# Patient Record
Sex: Female | Born: 2005 | Race: White | Hispanic: No | Marital: Single | Smoking: Never smoker
Health system: Southern US, Community
[De-identification: ages and names within clinical notes are randomized; demographics above are authoritative.]

## PROBLEM LIST (undated history)

## (undated) DIAGNOSIS — S069XAA Unspecified intracranial injury with loss of consciousness status unknown, initial encounter: Secondary | ICD-10-CM

## (undated) DIAGNOSIS — F32A Depression, unspecified: Secondary | ICD-10-CM

## (undated) DIAGNOSIS — J45909 Unspecified asthma, uncomplicated: Secondary | ICD-10-CM

## (undated) DIAGNOSIS — D649 Anemia, unspecified: Secondary | ICD-10-CM

---
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
## 2023-01-04 ENCOUNTER — Ambulatory Visit (HOSPITAL_COMMUNITY): Payer: No Typology Code available for payment source

## 2023-01-06 NOTE — L&D Delivery Note (Signed)
 Delivery Note Alyssa Cook is a 18 y.o. G1 now P1001 who had a spontaneous vaginal delivery [redacted]w[redacted]d after augmentation of labor.  At 20:11 a healthy baby boy was delivered via  (Presentation: vertex ; LOA  ).  APGAR: 8,9 ; weight 7lb5oz .     Admitted for rupture of membranes. Augmented with pitocin. Progressed normally. Received epidural for pain management. Pushed for 21 minutes. Baby was delivered without difficulty. No nuchal cord. Baby was placed on maternal abdomen where routine bulb suction and stimulation was performed. Delayed cord clamping for 60 seconds. Cord was clamped by me and cut by FOB. Cord blood was collected. Delivery of placenta was spontaneous. Placenta was found to be intact, 3-vessel cord was noted. The fundus was found to be firm. 2nd degree perineal laceration and 1st degree left sided vaginal laceration were repaired in the normal sterile fashion with 3-0 vicryl rapide. Estimated blood loss 250cc. Cord gases not sent. Instrument and gauze counts were correct at the end of the procedure. Fundus was firm, bleeding was minimal, and both mother and baby were doing well when I left the room.   Placenta status: intact, appeared to have multiple calcifications and fatty inclusions, marginal cord. Sent to pathology. Anesthesia:  epidural Episiotomy:  none Lacerations:  . 2nd degree perineal and 1st degree left sided vaginal  Suture Repair: 3.0 vicryl rapide Est. Blood Loss (mL):  250cc  Mom to postpartum.  Baby to Couplet care / Skin to Skin.  Mother still considering circumcision for baby boy Alyssa Cook. Will defer placing orders until further discussion takes place on rounds tomorrow.   Alyssa Cook 10/29/2023, 8:42 PM

## 2023-01-07 ENCOUNTER — Ambulatory Visit (HOSPITAL_COMMUNITY): Payer: No Typology Code available for payment source | Attending: Physician Assistant | Admitting: Speech Pathology

## 2023-01-07 ENCOUNTER — Encounter (HOSPITAL_COMMUNITY): Payer: Self-pay | Admitting: Speech Pathology

## 2023-01-07 DIAGNOSIS — R490 Dysphonia: Secondary | ICD-10-CM | POA: Diagnosis present

## 2023-01-07 DIAGNOSIS — R471 Dysarthria and anarthria: Secondary | ICD-10-CM | POA: Diagnosis present
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
## 2023-01-14 ENCOUNTER — Encounter (HOSPITAL_COMMUNITY): Payer: No Typology Code available for payment source | Admitting: Speech Pathology

## 2023-01-21 ENCOUNTER — Ambulatory Visit (HOSPITAL_COMMUNITY): Payer: No Typology Code available for payment source | Admitting: Speech Pathology

## 2023-01-21 ENCOUNTER — Encounter (HOSPITAL_COMMUNITY): Payer: Self-pay | Admitting: Speech Pathology

## 2023-01-21 DIAGNOSIS — R490 Dysphonia: Secondary | ICD-10-CM
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
## 2023-01-27 ENCOUNTER — Other Ambulatory Visit (INDEPENDENT_AMBULATORY_CARE_PROVIDER_SITE_OTHER): Payer: Self-pay | Admitting: Otolaryngology

## 2023-01-28 ENCOUNTER — Ambulatory Visit (HOSPITAL_COMMUNITY): Payer: No Typology Code available for payment source | Admitting: Speech Pathology

## 2023-01-28 ENCOUNTER — Encounter (HOSPITAL_COMMUNITY): Payer: Self-pay | Admitting: Speech Pathology

## 2023-01-28 DIAGNOSIS — R490 Dysphonia: Secondary | ICD-10-CM
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
## 2023-02-02 ENCOUNTER — Encounter
Payer: No Typology Code available for payment source | Attending: Physical Medicine and Rehabilitation | Admitting: Psychology

## 2023-02-02 DIAGNOSIS — J383 Other diseases of vocal cords: Secondary | ICD-10-CM | POA: Diagnosis present

## 2023-02-02 DIAGNOSIS — F32A Depression, unspecified: Secondary | ICD-10-CM

## 2023-02-02 DIAGNOSIS — F09 Unspecified 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: Secondary | ICD-10-CM | POA: Diagnosis not present

## 2023-02-02 DIAGNOSIS — F4310 Post-traumatic stress disorder, unspecified: Secondary | ICD-10-CM | POA: Diagnosis present

## 2023-02-02 DIAGNOSIS — R45851 Suicidal ideations: Secondary | ICD-10-CM

## 2023-02-02 DIAGNOSIS — S062X9S 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, sequela: Secondary | ICD-10-CM | POA: Insufficient documentation

## 2023-02-04 ENCOUNTER — Ambulatory Visit (HOSPITAL_COMMUNITY): Payer: No Typology Code available for payment source | Admitting: Speech Pathology

## 2023-02-04 ENCOUNTER — Encounter (HOSPITAL_COMMUNITY): Payer: Self-pay | Admitting: Speech Pathology

## 2023-02-04 DIAGNOSIS — R490 Dysphonia: Secondary | ICD-10-CM | POA: Diagnosis not present

## 2023-02-04 DIAGNOSIS — R471 Dysarthria and anarthria: Secondary | ICD-10-CM
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
## 2023-02-11 ENCOUNTER — Ambulatory Visit (HOSPITAL_COMMUNITY): Payer: No Typology Code available for payment source | Attending: Physician Assistant | Admitting: Speech Pathology

## 2023-02-11 ENCOUNTER — Encounter (HOSPITAL_COMMUNITY): Payer: Self-pay | Admitting: Speech Pathology

## 2023-02-11 DIAGNOSIS — R471 Dysarthria and anarthria: Secondary | ICD-10-CM | POA: Diagnosis present

## 2023-02-11 DIAGNOSIS — R490 Dysphonia: Secondary | ICD-10-CM | POA: Diagnosis present
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
## 2023-02-17 ENCOUNTER — Encounter
Payer: No Typology Code available for payment source | Attending: Physical Medicine and Rehabilitation | Admitting: Physical Medicine & Rehabilitation

## 2023-02-17 ENCOUNTER — Encounter: Payer: Self-pay | Admitting: Physical Medicine & Rehabilitation

## 2023-02-17 DIAGNOSIS — F4310 Post-traumatic stress disorder, unspecified: Secondary | ICD-10-CM | POA: Insufficient documentation

## 2023-02-17 MED ORDER — PRAZOSIN HCL 2 MG PO CAPS: 2.0000 mg | ORAL_CAPSULE | Freq: Every day | ORAL | 6 refills | Status: DC
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
## 2023-02-18 ENCOUNTER — Encounter (HOSPITAL_BASED_OUTPATIENT_CLINIC_OR_DEPARTMENT_OTHER): Payer: No Typology Code available for payment source | Admitting: Psychology

## 2023-02-18 DIAGNOSIS — R45851 Suicidal ideations: Secondary | ICD-10-CM

## 2023-02-18 DIAGNOSIS — F32A Depression, unspecified: Secondary | ICD-10-CM | POA: Diagnosis not present

## 2023-02-18 DIAGNOSIS — F4310 Post-traumatic stress disorder, unspecified: Secondary | ICD-10-CM

## 2023-02-18 DIAGNOSIS — F09 Unspecified 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: Secondary | ICD-10-CM

## 2023-02-18 DIAGNOSIS — S062X9S 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, sequela: Secondary | ICD-10-CM

## 2023-03-01 ENCOUNTER — Ambulatory Visit (INDEPENDENT_AMBULATORY_CARE_PROVIDER_SITE_OTHER): Payer: No Typology Code available for payment source | Admitting: Otolaryngology

## 2023-03-01 ENCOUNTER — Encounter (INDEPENDENT_AMBULATORY_CARE_PROVIDER_SITE_OTHER): Payer: Self-pay | Admitting: Otolaryngology

## 2023-03-01 VITALS — BP 110/71 | HR 74

## 2023-03-01 DIAGNOSIS — J3801 Paralysis of vocal cords and larynx, unilateral: Secondary | ICD-10-CM

## 2023-03-01 DIAGNOSIS — K219 Gastro-esophageal reflux disease without esophagitis: Secondary | ICD-10-CM

## 2023-03-01 DIAGNOSIS — R0981 Nasal congestion: Secondary | ICD-10-CM | POA: Diagnosis not present

## 2023-03-01 DIAGNOSIS — R490 Dysphonia: Secondary | ICD-10-CM | POA: Diagnosis not present

## 2023-03-01 DIAGNOSIS — J3089 Other allergic rhinitis: Secondary | ICD-10-CM

## 2023-03-01 DIAGNOSIS — J383 Other diseases of vocal cords: Secondary | ICD-10-CM | POA: Diagnosis not present

## 2023-03-01 DIAGNOSIS — R0982 Postnasal drip: Secondary | ICD-10-CM

## 2023-03-01 DIAGNOSIS — Z87820 Personal history of traumatic brain injury: Secondary | ICD-10-CM

## 2023-03-01 NOTE — Patient Instructions (Signed)
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
## 2023-03-09 ENCOUNTER — Ambulatory Visit: Payer: No Typology Code available for payment source | Admitting: Urology

## 2023-03-09 ENCOUNTER — Encounter: Payer: Self-pay | Admitting: Urology

## 2023-03-09 VITALS — BP 113/72 | HR 93 | Temp 99.0°F

## 2023-03-09 DIAGNOSIS — Z3200 Encounter for pregnancy test, result unknown: Secondary | ICD-10-CM | POA: Diagnosis not present

## 2023-03-09 DIAGNOSIS — R3915 Urgency of urination: Secondary | ICD-10-CM

## 2023-03-09 DIAGNOSIS — N3941 Urge incontinence: Secondary | ICD-10-CM | POA: Diagnosis not present

## 2023-03-09 LAB — BLADDER SCAN AMB NON-IMAGING: Scan Result: 0

## 2023-03-09 NOTE — Progress Notes (Signed)
 Bladder Scan completed today.  Patient cannot void prior to the bladder scan. Bladder scan result: 0  Performed By: [facility] LPN

## 2023-03-10 LAB — PREGNANCY, URINE: Preg Test, Ur: POSITIVE — AB

## 2023-03-10 LAB — URINALYSIS, ROUTINE W REFLEX MICROSCOPIC
Bilirubin, UA: NEGATIVE
Glucose, UA: NEGATIVE
Ketones, UA: NEGATIVE
Nitrite, UA: NEGATIVE
Protein,UA: NEGATIVE
RBC, UA: NEGATIVE
Specific Gravity, UA: 1.015 (ref 1.005–1.030)
Urobilinogen, Ur: 1 mg/dL (ref 0.2–1.0)
pH, UA: 8 — ABNORMAL HIGH (ref 5.0–7.5)

## 2023-03-10 LAB — MICROSCOPIC EXAMINATION

## 2023-03-10 NOTE — Progress Notes (Signed)
 Spoke with patient via phone to inform her that her pregnancy test was positive. She was instructed to start taking a prenatal vitamin daily and to contact her OB/GYN provider to schedule an appointment in the near future. Pt verbalized understanding and agreement.

## 2023-03-11 ENCOUNTER — Encounter
Payer: No Typology Code available for payment source | Attending: Physical Medicine and Rehabilitation | Admitting: Psychology

## 2023-03-11 DIAGNOSIS — S062X9S 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, sequela: Secondary | ICD-10-CM | POA: Diagnosis not present

## 2023-03-11 DIAGNOSIS — R45851 Suicidal ideations: Secondary | ICD-10-CM

## 2023-03-11 DIAGNOSIS — F09 Unspecified 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: Secondary | ICD-10-CM | POA: Diagnosis not present

## 2023-03-11 DIAGNOSIS — F4310 Post-traumatic stress disorder, unspecified: Secondary | ICD-10-CM | POA: Insufficient documentation

## 2023-03-11 DIAGNOSIS — F32A Depression, unspecified: Secondary | ICD-10-CM | POA: Diagnosis not present

## 2023-03-15 ENCOUNTER — Other Ambulatory Visit (INDEPENDENT_AMBULATORY_CARE_PROVIDER_SITE_OTHER): Payer: Self-pay | Admitting: Otolaryngology

## 2023-03-25 ENCOUNTER — Ambulatory Visit: Payer: No Typology Code available for payment source | Admitting: Psychology

## 2023-04-01 ENCOUNTER — Encounter (INDEPENDENT_AMBULATORY_CARE_PROVIDER_SITE_OTHER): Payer: Self-pay | Admitting: Otolaryngology

## 2023-04-01 ENCOUNTER — Ambulatory Visit (INDEPENDENT_AMBULATORY_CARE_PROVIDER_SITE_OTHER): Payer: No Typology Code available for payment source | Admitting: Otolaryngology

## 2023-04-01 VITALS — BP 115/72 | HR 74

## 2023-04-01 DIAGNOSIS — J3801 Paralysis of vocal cords and larynx, unilateral: Secondary | ICD-10-CM | POA: Diagnosis not present

## 2023-04-01 DIAGNOSIS — J383 Other diseases of vocal cords: Secondary | ICD-10-CM | POA: Diagnosis not present

## 2023-04-01 DIAGNOSIS — R490 Dysphonia: Secondary | ICD-10-CM | POA: Diagnosis not present

## 2023-04-01 DIAGNOSIS — S069X9S Unspecified intracranial injury with loss of consciousness of unspecified duration, sequela: Secondary | ICD-10-CM

## 2023-04-01 NOTE — Progress Notes (Signed)
 Therapeutic Vocal Cord Injection CPT 28413-24 ATTENDING: Ashok Croon, MD   PREOPERATIVE DIAGNOSIS(ES): 1. left vocal cord paresis 2. Hoarseness; VF atrophy bilateral 3. Glottic insufficiency  POSTOPERATIVE DIAGNOSIS(ES): Same  PROCEDURE PERFORMED: Laryngoscopy with restylane injection into the bilateral lateral thyroarytenoid muscle(s)  INDICATIONS FOR PROCEDURE: The risks and benefits of the surgical procedure have been explained in detail to the patient and they have elected to proceed.  CONSENT:  Informed consent was obtained prior to the procedure after discussion of risks, benefits, and alternatives and expected outcomes were discussed with the patient; consent placed in chart. The possibilities of reaction to medication, pulmonary aspiration, bleeding, infection, the need for additional procedures, failure to diagnose a condition, and creating a complication requiring transfusion or operation were discussed with the patient. The patient concurred with the proposed plan, giving informed consent.    UNIVERSAL PROTOCOL/ TIMEOUT: Preprocedure verification is complete- patient verified and consents confirmed.  ANESTHESIA: local anesthesia H&P REVIEW: The patient's history and physical were reviewed today prior to procedure. All medications were reviewed and updated as well.  PROCEDURE DETAILS:  The patient was brought to the clinic and placed in a seated position.  The anterior neck skin was then cleansed with alcohol and 1% Lidocaine was used to infiltrate the skin overlying the thyrohyoid membrane. Patient had a NEB with 4% lidocaine prior the start of the procedure, to ensure good anesthesia and procedure tolerance. Afrin/Lidocaine mixture was then used to anesthetize the nasal passages. The 1.5-inch 25-gauge needle was bent with two gentle curves in same direction.  The flexible laryngoscope was then passed through the patient's nasal passageway and advanced into the larynx. The  nasopharynx was free of any lesions. The scope was passed behind the soft palate and directed toward the base of tongue. The supraglottic structures were normal in appearance. The true folds show atrophy and left vocal fold paralysis. The 23-gauge needle was passed through the petiole and 4% lidocaine was dripped on the cords while the patient phonated. It was then attached to a luer-lock syringe filled with the filler and advanced into the vocal fold, and injection was performed into the bilateral thyroarytenoid muscle(s) at a site just anterior to the vocal process. A second injection was performed at mid-fold. The augmentation carried out until some overmedialization was noted. This was then repeated on the contralateral side. The needle was then removed from the vocal fold and the airway. The scope was removed from the nasal cavity. This completed the procedure. The patient tolerated the procedure well.  IMPLANTS: Restylane-L Hyaluronic Acid  ESTIMATED BLOOD LOSS: None  SPECIMEN(S) REMOVED: None  DISPOSITION OF SPECIMEN(S): NA.  FINDINGS:  No evidence of a hematoma and the airway remained patent  CONDITION: Stable  COMPLICATIONS:The patient tolerated the procedure well without apparent complications and was ambulatory.  NOTES: tolerated well   PLAN: RTC 3 weeks for repeat videostrobe

## 2023-04-07 ENCOUNTER — Other Ambulatory Visit: Payer: Self-pay | Admitting: Physical Medicine & Rehabilitation

## 2023-04-07 DIAGNOSIS — F4310 Post-traumatic stress disorder, unspecified: Secondary | ICD-10-CM

## 2023-04-15 ENCOUNTER — Encounter
Payer: No Typology Code available for payment source | Attending: Physical Medicine and Rehabilitation | Admitting: Psychology

## 2023-04-15 DIAGNOSIS — F32A Depression, unspecified: Secondary | ICD-10-CM | POA: Diagnosis not present

## 2023-04-15 DIAGNOSIS — F09 Unspecified 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: Secondary | ICD-10-CM | POA: Diagnosis not present

## 2023-04-15 DIAGNOSIS — S062X9S 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, sequela: Secondary | ICD-10-CM | POA: Diagnosis present

## 2023-04-15 DIAGNOSIS — F4310 Post-traumatic stress disorder, unspecified: Secondary | ICD-10-CM | POA: Diagnosis not present

## 2023-04-15 DIAGNOSIS — R45851 Suicidal ideations: Secondary | ICD-10-CM | POA: Insufficient documentation

## 2023-04-16 LAB — OB RESULTS CONSOLE HEPATITIS B SURFACE ANTIGEN: Hepatitis B Surface Ag: NEGATIVE

## 2023-04-16 LAB — HEPATITIS C ANTIBODY: HCV Ab: NEGATIVE

## 2023-04-16 LAB — OB RESULTS CONSOLE RUBELLA ANTIBODY, IGM: Rubella: UNDETERMINED

## 2023-04-16 LAB — OB RESULTS CONSOLE GC/CHLAMYDIA
Chlamydia: NEGATIVE
Neisseria Gonorrhea: NEGATIVE

## 2023-04-16 LAB — OB RESULTS CONSOLE RPR: RPR: NONREACTIVE

## 2023-04-16 LAB — OB RESULTS CONSOLE HIV ANTIBODY (ROUTINE TESTING): HIV: NONREACTIVE

## 2023-04-19 ENCOUNTER — Telehealth: Payer: Self-pay | Admitting: Physical Medicine & Rehabilitation

## 2023-04-19 NOTE — Telephone Encounter (Signed)
 Patient is pregnant now and mom would like for Dr. Rachel Budds call her to discuss medication changes.  Her phone number is [phone].

## 2023-04-21 ENCOUNTER — Encounter: Payer: Self-pay | Admitting: Psychology
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
## 2023-04-22 ENCOUNTER — Ambulatory Visit (INDEPENDENT_AMBULATORY_CARE_PROVIDER_SITE_OTHER): Admitting: Otolaryngology
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
## 2023-05-18 NOTE — Progress Notes (Unsigned)
   Subjective:    Patient ID: Alyssa Cook, female    DOB: 2005/07/19, 18 y.o.   MRN: [id number]  HPI   Pain Inventory Average Pain {NUMBERS; 0-10:5044} Pain Right Now {NUMBERS; 0-10:5044} My pain is {PAIN DESCRIPTION:21022940}  LOCATION OF PAIN  ***  BOWEL Number of stools per week: *** Oral laxative use {YES/NO:21197} Type of laxative *** Enema or suppository use {YES/NO:21197} History of colostomy {YES/NO:21197} Incontinent {YES/NO:21197}  BLADDER {bladder options:24190} In and out cath, frequency *** Able to self cath {YES/NO:21197} Bladder incontinence {YES/NO:21197} Frequent urination {YES/NO:21197} Leakage with coughing {YES/NO:21197} Difficulty starting stream {YES/NO:21197} Incomplete bladder emptying {YES/NO:21197}   Mobility {MOBILITY WUJ:81191478}  Function {FUNCTION:21022946}  Neuro/Psych {NEURO/PSYCH:21022948}  Prior Studies {CPRM PRIOR STUDIES:21022953}  Physicians involved in your care {CPRM PHYSICIANS INVOLVED IN YOUR CARE:21022954}   Family History  Problem Relation Age of Onset   Heart disease Other    Social History   Socioeconomic History   Marital status: Single    Spouse name: Not on file   Number of children: Not on file   Years of education: Not on file   Highest education level: Not on file  Occupational History   Not on file  Tobacco Use   Smoking status: Never    Passive exposure: Never   Smokeless tobacco: Never  Vaping Use   Vaping status: Never Used  Substance and Sexual Activity   Alcohol use: No   Drug use: Not Currently    Types: Marijuana   Sexual activity: Yes    Birth control/protection: Implant  Other Topics Concern   Not on file  Social History Narrative   ** Merged History Encounter **       Social Drivers of Health   Financial Resource Strain: Not on file  Food Insecurity: Not on file  Transportation Needs: Not on file  Physical Activity: Not on file  Stress: Not on file  Social  Connections: Not on file   Past Surgical History:  Procedure Laterality Date   IR REPLACE G-TUBE SIMPLE WO FLUORO  03/03/2022   Past Medical History:  Diagnosis Date   Asthma    Brain injury (HCC)    There were no vitals taken for this visit.  Opioid Risk Score:   Fall Risk Score:  `1  Depression screen [facility] 2/9     10/21/2022    3:06 PM 08/19/2022    3:27 PM 07/01/2022   10:28 AM 04/01/2022   11:29 AM  Depression screen PHQ 2/9  Decreased Interest 0 3 1 1   Down, Depressed, Hopeless 0 3 1 2   PHQ - 2 Score 0 6 2 3   Altered sleeping  0  2  Tired, decreased energy  2  3  Change in appetite  0  0  Feeling bad or failure about yourself   3  3  Trouble concentrating    1  Moving slowly or fidgety/restless  3  0  Suicidal thoughts  1  1  PHQ-9 Score  15  13    Review of Systems     Objective:   Physical Exam        Assessment & Plan:

## 2023-05-19 ENCOUNTER — Encounter: Payer: Self-pay | Admitting: Physical Medicine & Rehabilitation

## 2023-05-19 ENCOUNTER — Encounter: Attending: Physical Medicine and Rehabilitation | Admitting: Physical Medicine & Rehabilitation

## 2023-05-19 VITALS — BP 104/68 | HR 73 | Ht 65.0 in | Wt 130.0 lb

## 2023-05-19 DIAGNOSIS — R45851 Suicidal ideations: Secondary | ICD-10-CM | POA: Insufficient documentation

## 2023-05-19 DIAGNOSIS — O99320 Drug use complicating pregnancy, unspecified trimester: Secondary | ICD-10-CM | POA: Insufficient documentation

## 2023-05-19 DIAGNOSIS — Z87820 Personal history of traumatic brain injury: Secondary | ICD-10-CM | POA: Diagnosis not present

## 2023-05-19 DIAGNOSIS — R490 Dysphonia: Secondary | ICD-10-CM | POA: Diagnosis not present

## 2023-05-19 DIAGNOSIS — F32A Depression, unspecified: Secondary | ICD-10-CM | POA: Diagnosis not present

## 2023-05-19 DIAGNOSIS — O26899 Other specified pregnancy related conditions, unspecified trimester: Secondary | ICD-10-CM | POA: Insufficient documentation

## 2023-05-19 DIAGNOSIS — S062X9S 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, sequela: Secondary | ICD-10-CM | POA: Insufficient documentation

## 2023-05-19 DIAGNOSIS — Z3490 Encounter for supervision of normal pregnancy, unspecified, unspecified trimester: Secondary | ICD-10-CM | POA: Insufficient documentation

## 2023-05-19 DIAGNOSIS — N393 Stress incontinence (female) (male): Secondary | ICD-10-CM | POA: Diagnosis not present

## 2023-05-19 DIAGNOSIS — Z3A00 Weeks of gestation of pregnancy not specified: Secondary | ICD-10-CM | POA: Diagnosis not present

## 2023-05-19 DIAGNOSIS — R4587 Impulsiveness: Secondary | ICD-10-CM | POA: Insufficient documentation

## 2023-05-19 DIAGNOSIS — Z79899 Other long term (current) drug therapy: Secondary | ICD-10-CM | POA: Diagnosis not present

## 2023-05-19 DIAGNOSIS — Z3491 Encounter for supervision of normal pregnancy, unspecified, first trimester: Secondary | ICD-10-CM | POA: Diagnosis not present

## 2023-05-19 DIAGNOSIS — F4310 Post-traumatic stress disorder, unspecified: Secondary | ICD-10-CM | POA: Diagnosis present

## 2023-05-19 MED ORDER — PRAZOSIN HCL 2 MG PO CAPS: 2.0000 mg | ORAL_CAPSULE | Freq: Every day | ORAL | 6 refills | Status: AC
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
## 2023-05-19 NOTE — Patient Instructions (Signed)
 ALWAYS FEEL FREE TO CALL OUR OFFICE WITH ANY PROBLEMS OR QUESTIONS [phone])  **PLEASE NOTE** ALL MEDICATION REFILL REQUESTS (INCLUDING CONTROLLED SUBSTANCES) NEED TO BE MADE AT LEAST 7 DAYS PRIOR TO REFILL BEING DUE. ANY REFILL REQUESTS INSIDE THAT TIME FRAME MAY RESULT IN DELAYS IN RECEIVING YOUR PRESCRIPTION.

## 2023-05-27 ENCOUNTER — Telehealth: Payer: Self-pay

## 2023-05-27 ENCOUNTER — Other Ambulatory Visit: Payer: Self-pay | Admitting: Obstetrics and Gynecology

## 2023-05-27 ENCOUNTER — Encounter (HOSPITAL_BASED_OUTPATIENT_CLINIC_OR_DEPARTMENT_OTHER): Payer: No Typology Code available for payment source | Admitting: Psychology

## 2023-05-27 DIAGNOSIS — S062X9S 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, sequela: Secondary | ICD-10-CM | POA: Diagnosis not present

## 2023-05-27 DIAGNOSIS — F4310 Post-traumatic stress disorder, unspecified: Secondary | ICD-10-CM

## 2023-05-27 DIAGNOSIS — O26899 Other specified pregnancy related conditions, unspecified trimester: Secondary | ICD-10-CM | POA: Diagnosis not present

## 2023-06-01 ENCOUNTER — Encounter: Payer: Self-pay | Admitting: Psychology
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
## 2023-06-16 ENCOUNTER — Ambulatory Visit: Payer: No Typology Code available for payment source | Admitting: Physical Medicine & Rehabilitation

## 2023-06-18 ENCOUNTER — Encounter: Payer: Self-pay | Admitting: Physical Medicine & Rehabilitation

## 2023-06-29 ENCOUNTER — Ambulatory Visit: Admitting: Maternal & Fetal Medicine

## 2023-06-29 ENCOUNTER — Ambulatory Visit: Attending: Obstetrics and Gynecology

## 2023-06-29 VITALS — BP 111/68 | HR 100

## 2023-06-29 DIAGNOSIS — S069X9S Unspecified intracranial injury with loss of consciousness of unspecified duration, sequela: Secondary | ICD-10-CM

## 2023-06-29 DIAGNOSIS — Z3A20 20 weeks gestation of pregnancy: Secondary | ICD-10-CM | POA: Diagnosis present

## 2023-06-29 DIAGNOSIS — O99352 Diseases of the nervous system complicating pregnancy, second trimester: Secondary | ICD-10-CM

## 2023-06-29 DIAGNOSIS — Z3A19 19 weeks gestation of pregnancy: Secondary | ICD-10-CM | POA: Diagnosis not present

## 2023-06-29 DIAGNOSIS — Z87820 Personal history of traumatic brain injury: Secondary | ICD-10-CM | POA: Diagnosis not present

## 2023-06-29 DIAGNOSIS — Z148 Genetic carrier of other disease: Secondary | ICD-10-CM | POA: Diagnosis not present

## 2023-06-29 DIAGNOSIS — O358XX0 Maternal care for other (suspected) fetal abnormality and damage, not applicable or unspecified: Secondary | ICD-10-CM | POA: Diagnosis not present
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
## 2023-08-01 ENCOUNTER — Other Ambulatory Visit: Payer: Self-pay | Admitting: Physical Medicine & Rehabilitation

## 2023-08-01 DIAGNOSIS — S062X9S 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, sequela: Secondary | ICD-10-CM

## 2023-08-01 DIAGNOSIS — F32A Depression, unspecified: Secondary | ICD-10-CM

## 2023-08-03 NOTE — Telephone Encounter (Signed)
 His last note says Prozac  60 mg daily; does she need the 20 as well?

## 2023-08-03 NOTE — Telephone Encounter (Signed)
 Looks like he's filling the 20s in 90 day supplies; medication sent.

## 2023-09-08 ENCOUNTER — Other Ambulatory Visit (INDEPENDENT_AMBULATORY_CARE_PROVIDER_SITE_OTHER): Payer: Self-pay | Admitting: Otolaryngology

## 2023-09-10 ENCOUNTER — Non-Acute Institutional Stay (HOSPITAL_COMMUNITY)
Admission: RE | Admit: 2023-09-10 | Discharge: 2023-09-10 | Disposition: A | Discharge status: Home / Self Care | Source: Ambulatory Visit | Attending: Internal Medicine | Admitting: Internal Medicine

## 2023-09-10 DIAGNOSIS — D509 Iron deficiency anemia, unspecified: Secondary | ICD-10-CM | POA: Diagnosis present

## 2023-09-10 MED ORDER — SODIUM CHLORIDE 0.9 % IV SOLN: INTRAVENOUS | Status: DC | PRN

## 2023-09-10 MED ORDER — SODIUM CHLORIDE 0.9 % IV SOLN
510.0000 mg | Freq: Once | INTRAVENOUS | Status: AC
  Administered 2023-09-10: 510 mg via INTRAVENOUS
  Filled 2023-09-10: qty 17

## 2023-09-10 NOTE — Progress Notes (Signed)
 Diagnosis:  Iron deficiency anemia    Provider: Jolene Paper, MD    Procedure: Fereheme 510 mg IV 1 of 2     Note:  pt received Fereheme  IV first dose, no pretreatment.  Patient tolerated well.  Patient was observed 30 minutes post infusion without incident.  Pt walked to front desk to schedule 2nd dose.  Vital signs were stable upon discharge.

## 2023-09-13 ENCOUNTER — Telehealth: Payer: Self-pay

## 2023-09-13 ENCOUNTER — Other Ambulatory Visit: Payer: Self-pay | Admitting: Obstetrics

## 2023-09-13 DIAGNOSIS — D508 Other iron deficiency anemias: Secondary | ICD-10-CM | POA: Insufficient documentation

## 2023-09-13 DIAGNOSIS — D509 Iron deficiency anemia, unspecified: Secondary | ICD-10-CM | POA: Insufficient documentation

## 2023-09-13 NOTE — Telephone Encounter (Signed)
 Auth Submission: NO AUTH NEEDED Site of care: Site of care: [facility] Payer: [place] [employer]ICAID [employer]  Medication & CPT/J Code(s) submitted: Feraheme (ferumoxytol ) [id number] Diagnosis Code:  Route of submission (phone, fax, portal): phone Phone # Fax # Auth type: Buy/Bill HB Units/visits requested: 510mg  x 2doses Reference number:  Approval from: 09/13/23 to 01/05/24
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
## 2023-09-15 ENCOUNTER — Encounter: Attending: Physical Medicine & Rehabilitation | Admitting: Physical Medicine & Rehabilitation

## 2023-09-15 ENCOUNTER — Encounter: Payer: Self-pay | Admitting: Physical Medicine & Rehabilitation

## 2023-09-15 VITALS — BP 97/62 | HR 108 | Ht 65.0 in | Wt 149.2 lb

## 2023-09-15 DIAGNOSIS — R41840 Attention and concentration deficit: Secondary | ICD-10-CM | POA: Insufficient documentation

## 2023-09-15 DIAGNOSIS — S062X9S 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, sequela: Secondary | ICD-10-CM | POA: Insufficient documentation

## 2023-09-15 DIAGNOSIS — J383 Other diseases of vocal cords: Secondary | ICD-10-CM | POA: Insufficient documentation

## 2023-09-15 DIAGNOSIS — F09 Unspecified 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: Secondary | ICD-10-CM | POA: Diagnosis present

## 2023-09-15 NOTE — Patient Instructions (Addendum)
 ALWAYS FEEL FREE TO CALL OUR OFFICE WITH ANY PROBLEMS OR QUESTIONS [phone])  **PLEASE NOTE** ALL MEDICATION REFILL REQUESTS (INCLUDING CONTROLLED SUBSTANCES) NEED TO BE MADE AT LEAST 7 DAYS PRIOR TO REFILL BEING DUE. ANY REFILL REQUESTS INSIDE THAT TIME FRAME MAY RESULT IN DELAYS IN RECEIVING YOUR PRESCRIPTION.     LET ME KNOW ABOUT YOUR FORMS

## 2023-09-16 ENCOUNTER — Non-Acute Institutional Stay (HOSPITAL_COMMUNITY): Admission: RE | Admit: 2023-09-16 | Source: Ambulatory Visit

## 2023-09-16 ENCOUNTER — Ambulatory Visit (HOSPITAL_COMMUNITY)
Admission: RE | Admit: 2023-09-16 | Discharge: 2023-09-16 | Disposition: A | Discharge status: Home / Self Care | Source: Ambulatory Visit | Attending: Internal Medicine | Admitting: Internal Medicine

## 2023-09-16 VITALS — BP 97/57 | HR 97 | Temp 97.4°F | Resp 18

## 2023-09-16 DIAGNOSIS — D509 Iron deficiency anemia, unspecified: Secondary | ICD-10-CM | POA: Diagnosis present

## 2023-09-16 DIAGNOSIS — D508 Other iron deficiency anemias: Secondary | ICD-10-CM | POA: Insufficient documentation

## 2023-09-16 MED ORDER — SODIUM CHLORIDE 0.9 % IV SOLN
510.0000 mg | Freq: Once | INTRAVENOUS | Status: AC
  Administered 2023-09-16: 510 mg via INTRAVENOUS
  Filled 2023-09-16: qty 17

## 2023-09-16 NOTE — Progress Notes (Signed)
 Diagnosis:Iron deficiency Anemia    Provider: Jolene Gaskins    Procedure: Fereheme 510 mg IV infusion    Note: pt received Fereheme IV 510 mg, due to iron deficiency anemia, no pretreatment given.  Pt declined 30 minute observation period.  Vital signs stable upon discharge.  Ambulated to exit without difficulty, no follow up appt. Needed this was 2/2 infusions.

## 2023-09-27 ENCOUNTER — Encounter: Payer: Self-pay | Admitting: Psychology

## 2023-10-07 ENCOUNTER — Encounter: Payer: Self-pay | Admitting: Psychology

## 2023-10-07 ENCOUNTER — Encounter: Attending: Psychology | Admitting: Psychology

## 2023-10-07 DIAGNOSIS — R41840 Attention and concentration deficit: Secondary | ICD-10-CM | POA: Diagnosis not present

## 2023-10-07 DIAGNOSIS — F4310 Post-traumatic stress disorder, unspecified: Secondary | ICD-10-CM | POA: Insufficient documentation

## 2023-10-07 DIAGNOSIS — F09 Unspecified 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: Secondary | ICD-10-CM | POA: Insufficient documentation

## 2023-10-07 DIAGNOSIS — S062X9S 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, sequela: Secondary | ICD-10-CM | POA: Insufficient documentation
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
## 2023-10-19 LAB — OB RESULTS CONSOLE GBS: GBS: NEGATIVE

## 2023-10-29 ENCOUNTER — Inpatient Hospital Stay (HOSPITAL_COMMUNITY)
Admission: AD | Admit: 2023-10-29 | Discharge: 2023-10-31 | DRG: 807 | Disposition: A | Discharge status: Home / Self Care | Attending: Obstetrics and Gynecology | Admitting: Obstetrics and Gynecology

## 2023-10-29 ENCOUNTER — Inpatient Hospital Stay (HOSPITAL_COMMUNITY): Admitting: Anesthesiology

## 2023-10-29 ENCOUNTER — Other Ambulatory Visit: Payer: Self-pay

## 2023-10-29 ENCOUNTER — Encounter (HOSPITAL_COMMUNITY): Payer: Self-pay | Admitting: Obstetrics and Gynecology

## 2023-10-29 DIAGNOSIS — O9902 Anemia complicating childbirth: Secondary | ICD-10-CM | POA: Diagnosis present

## 2023-10-29 DIAGNOSIS — O99344 Other mental disorders complicating childbirth: Secondary | ICD-10-CM | POA: Diagnosis present

## 2023-10-29 DIAGNOSIS — Z87820 Personal history of traumatic brain injury: Secondary | ICD-10-CM

## 2023-10-29 DIAGNOSIS — Z148 Genetic carrier of other disease: Secondary | ICD-10-CM

## 2023-10-29 DIAGNOSIS — F39 Unspecified mood [affective] disorder: Secondary | ICD-10-CM | POA: Diagnosis present

## 2023-10-29 DIAGNOSIS — Z3A38 38 weeks gestation of pregnancy: Secondary | ICD-10-CM

## 2023-10-29 DIAGNOSIS — K219 Gastro-esophageal reflux disease without esophagitis: Secondary | ICD-10-CM | POA: Diagnosis present

## 2023-10-29 DIAGNOSIS — Z8249 Family history of ischemic heart disease and other diseases of the circulatory system: Secondary | ICD-10-CM

## 2023-10-29 DIAGNOSIS — Z87891 Personal history of nicotine dependence: Secondary | ICD-10-CM | POA: Diagnosis not present

## 2023-10-29 DIAGNOSIS — O9962 Diseases of the digestive system complicating childbirth: Secondary | ICD-10-CM | POA: Diagnosis present

## 2023-10-29 DIAGNOSIS — O4292 Full-term premature rupture of membranes, unspecified as to length of time between rupture and onset of labor: Secondary | ICD-10-CM | POA: Diagnosis present

## 2023-10-29 DIAGNOSIS — F4310 Post-traumatic stress disorder, unspecified: Secondary | ICD-10-CM | POA: Diagnosis present

## 2023-10-29 DIAGNOSIS — Z3493 Encounter for supervision of normal pregnancy, unspecified, third trimester: Principal | ICD-10-CM

## 2023-10-29 HISTORY — DX: Depression, unspecified: F32.A

## 2023-10-29 HISTORY — DX: Anemia, unspecified: D64.9

## 2023-10-29 LAB — CBC
HCT: 33.4 % — ABNORMAL LOW (ref 36.0–46.0)
Hemoglobin: 11.1 g/dL — ABNORMAL LOW (ref 12.0–15.0)
MCH: 29.1 pg (ref 26.0–34.0)
MCHC: 33.2 g/dL (ref 30.0–36.0)
MCV: 87.4 fL (ref 80.0–100.0)
Platelets: 237 K/uL (ref 150–400)
RBC: 3.82 MIL/uL — ABNORMAL LOW (ref 3.87–5.11)
RDW: 14.6 % (ref 11.5–15.5)
WBC: 12.4 K/uL — ABNORMAL HIGH (ref 4.0–10.5)
nRBC: 0 % (ref 0.0–0.2)

## 2023-10-29 LAB — TYPE AND SCREEN
ABO/RH(D): O POS
Antibody Screen: NEGATIVE

## 2023-10-29 LAB — POCT FERN TEST: POCT Fern Test: POSITIVE

## 2023-10-29 MED ORDER — FENTANYL-BUPIVACAINE-NACL 0.5-0.125-0.9 MG/250ML-% EP SOLN
12.0000 mL/h | EPIDURAL | Status: DC | PRN
  Administered 2023-10-29: 12 mL/h via EPIDURAL
  Filled 2023-10-29: qty 250

## 2023-10-29 MED ORDER — WITCH HAZEL-GLYCERIN EX PADS: 1.0000 | MEDICATED_PAD | CUTANEOUS | Status: DC | PRN

## 2023-10-29 MED ORDER — OXYTOCIN-SODIUM CHLORIDE 30-0.9 UT/500ML-% IV SOLN
1.0000 m[IU]/min | INTRAVENOUS | Status: DC
  Administered 2023-10-29: 2 m[IU]/min via INTRAVENOUS
  Filled 2023-10-29: qty 500

## 2023-10-29 MED ORDER — DIPHENHYDRAMINE HCL 25 MG PO CAPS: 25.0000 mg | ORAL_CAPSULE | Freq: Four times a day (QID) | ORAL | Status: DC | PRN

## 2023-10-29 MED ORDER — ONDANSETRON HCL 4 MG/2ML IJ SOLN: 4.0000 mg | INTRAMUSCULAR | Status: DC | PRN

## 2023-10-29 MED ORDER — DIBUCAINE (PERIANAL) 1 % EX OINT: 1.0000 | TOPICAL_OINTMENT | CUTANEOUS | Status: DC | PRN

## 2023-10-29 MED ORDER — OXYTOCIN-SODIUM CHLORIDE 30-0.9 UT/500ML-% IV SOLN
2.5000 [IU]/h | INTRAVENOUS | Status: DC
Start: 2023-10-29 — End: 2023-10-29
  Administered 2023-10-29: 2.5 [IU]/h via INTRAVENOUS

## 2023-10-29 MED ORDER — BENZOCAINE-MENTHOL 20-0.5 % EX AERO
1.0000 | INHALATION_SPRAY | CUTANEOUS | Status: DC | PRN
  Administered 2023-10-29: 1 via TOPICAL
  Filled 2023-10-29: qty 56

## 2023-10-29 MED ORDER — OXYCODONE-ACETAMINOPHEN 5-325 MG PO TABS: 1.0000 | ORAL_TABLET | ORAL | Status: DC | PRN

## 2023-10-29 MED ORDER — BUPIVACAINE HCL (PF) 0.25 % IJ SOLN
INTRAMUSCULAR | Status: DC | PRN
  Administered 2023-10-29 (×2): 5 mL via EPIDURAL

## 2023-10-29 MED ORDER — SIMETHICONE 80 MG PO CHEW: 80.0000 mg | CHEWABLE_TABLET | ORAL | Status: DC | PRN

## 2023-10-29 MED ORDER — EPHEDRINE 5 MG/ML INJ: 10.0000 mg | INTRAVENOUS | Status: DC | PRN

## 2023-10-29 MED ORDER — PHENYLEPHRINE 80 MCG/ML (10ML) SYRINGE FOR IV PUSH (FOR BLOOD PRESSURE SUPPORT)
80.0000 ug | PREFILLED_SYRINGE | INTRAVENOUS | Status: DC | PRN
Start: 2023-10-29 — End: 2023-10-29

## 2023-10-29 MED ORDER — ZOLPIDEM TARTRATE 5 MG PO TABS: 5.0000 mg | ORAL_TABLET | Freq: Every evening | ORAL | Status: DC | PRN

## 2023-10-29 MED ORDER — TETANUS-DIPHTH-ACELL PERTUSSIS 5-2-15.5 LF-MCG/0.5 IM SUSP: 0.5000 mL | Freq: Once | INTRAMUSCULAR | Status: DC

## 2023-10-29 MED ORDER — OXYTOCIN BOLUS FROM INFUSION
333.0000 mL | Freq: Once | INTRAVENOUS | Status: AC
  Administered 2023-10-29: 333 mL via INTRAVENOUS

## 2023-10-29 MED ORDER — ACETAMINOPHEN 325 MG PO TABS: 650.0000 mg | ORAL_TABLET | ORAL | Status: DC | PRN

## 2023-10-29 MED ORDER — SOD CITRATE-CITRIC ACID 500-334 MG/5ML PO SOLN: 30.0000 mL | ORAL | Status: DC | PRN

## 2023-10-29 MED ORDER — OXYCODONE-ACETAMINOPHEN 5-325 MG PO TABS: 2.0000 | ORAL_TABLET | ORAL | Status: DC | PRN

## 2023-10-29 MED ORDER — FLUOXETINE HCL 20 MG PO CAPS
60.0000 mg | ORAL_CAPSULE | Freq: Every day | ORAL | Status: DC
Start: 2023-10-30 — End: 2023-10-31
  Administered 2023-10-30 – 2023-10-31 (×2): 60 mg via ORAL
  Filled 2023-10-29 (×2): qty 3

## 2023-10-29 MED ORDER — IBUPROFEN 600 MG PO TABS
600.0000 mg | ORAL_TABLET | Freq: Four times a day (QID) | ORAL | Status: DC
  Administered 2023-10-29 – 2023-10-31 (×7): 600 mg via ORAL
  Filled 2023-10-29 (×7): qty 1

## 2023-10-29 MED ORDER — COCONUT OIL OIL: 1.0000 | TOPICAL_OIL | Status: DC | PRN

## 2023-10-29 MED ORDER — FLUTICASONE PROPIONATE 50 MCG/ACT NA SUSP
1.0000 | Freq: Every day | NASAL | Status: DC
  Administered 2023-10-30 – 2023-10-31 (×2): 1 via NASAL
  Filled 2023-10-29: qty 16

## 2023-10-29 MED ORDER — DIPHENHYDRAMINE HCL 50 MG/ML IJ SOLN: 12.5000 mg | INTRAMUSCULAR | Status: DC | PRN

## 2023-10-29 MED ORDER — ONDANSETRON HCL 4 MG/2ML IJ SOLN
4.0000 mg | Freq: Four times a day (QID) | INTRAMUSCULAR | Status: DC | PRN
  Administered 2023-10-29: 4 mg via INTRAVENOUS
  Filled 2023-10-29: qty 2

## 2023-10-29 MED ORDER — LIDOCAINE HCL (PF) 1 % IJ SOLN: 30.0000 mL | INTRAMUSCULAR | Status: DC | PRN

## 2023-10-29 MED ORDER — PRAZOSIN HCL 2 MG PO CAPS
2.0000 mg | ORAL_CAPSULE | Freq: Every day | ORAL | Status: DC
  Administered 2023-10-30 (×2): 2 mg via ORAL
  Filled 2023-10-29 (×3): qty 1

## 2023-10-29 MED ORDER — PHENYLEPHRINE 80 MCG/ML (10ML) SYRINGE FOR IV PUSH (FOR BLOOD PRESSURE SUPPORT): 80.0000 ug | PREFILLED_SYRINGE | INTRAVENOUS | Status: DC | PRN

## 2023-10-29 MED ORDER — TERBUTALINE SULFATE 1 MG/ML IJ SOLN: 0.2500 mg | Freq: Once | INTRAMUSCULAR | Status: DC | PRN

## 2023-10-29 MED ORDER — ONDANSETRON HCL 4 MG PO TABS: 4.0000 mg | ORAL_TABLET | ORAL | Status: DC | PRN

## 2023-10-29 MED ORDER — LACTATED RINGERS IV SOLN: INTRAVENOUS | Status: DC

## 2023-10-29 MED ORDER — PRENATAL MULTIVITAMIN CH
1.0000 | ORAL_TABLET | Freq: Every day | ORAL | Status: DC
  Administered 2023-10-30 – 2023-10-31 (×2): 1 via ORAL
  Filled 2023-10-29 (×2): qty 1

## 2023-10-29 MED ORDER — SENNOSIDES-DOCUSATE SODIUM 8.6-50 MG PO TABS
2.0000 | ORAL_TABLET | Freq: Every day | ORAL | Status: DC
  Administered 2023-10-30 – 2023-10-31 (×2): 2 via ORAL
  Filled 2023-10-29 (×2): qty 2

## 2023-10-29 MED ORDER — FLUOXETINE HCL 20 MG PO CAPS: 20.0000 mg | ORAL_CAPSULE | Freq: Every day | ORAL | Status: DC

## 2023-10-29 MED ORDER — LACTATED RINGERS IV SOLN
500.0000 mL | Freq: Once | INTRAVENOUS | Status: AC
  Administered 2023-10-29: 500 mL via INTRAVENOUS

## 2023-10-29 MED ORDER — LACTATED RINGERS IV SOLN: 500.0000 mL | INTRAVENOUS | Status: DC | PRN

## 2023-10-29 NOTE — Anesthesia Procedure Notes (Signed)
 Epidural Patient location during procedure: OB Start time: 10/29/2023 4:09 PM End time: 10/29/2023 4:18 PM  Staffing Anesthesiologist: Jerrye Sharper, MD Performed: anesthesiologist   Preanesthetic Checklist Completed: patient identified, IV checked, site marked, risks and benefits discussed, surgical consent, monitors and equipment checked, pre-op evaluation and timeout performed  Epidural Patient position: sitting Prep: DuraPrep and site prepped and draped Patient monitoring: continuous pulse ox and blood pressure Approach: midline Location: L3-L4 Injection technique: LOR air  Needle:  Needle type: Tuohy  Needle gauge: 17 G Needle length: 9 cm and 9 Needle insertion depth: 5 cm Catheter type: closed end flexible Catheter size: 19 Gauge Catheter at skin depth: 10 cm Test dose: negative and Other  Assessment Events: blood not aspirated, no cerebrospinal fluid, injection not painful, no injection resistance, no paresthesia and negative IV test  Additional Notes Patient identified. Risks and benefits discussed including failed block, incomplete  Pain control, post dural puncture headache, nerve damage, paralysis, blood pressure Changes, nausea, vomiting, reactions to medications-both toxic and allergic and post Partum back pain. All questions were answered. Patient expressed understanding and wished to proceed. Sterile technique was used throughout procedure. Epidural site was Dressed with sterile barrier dressing. No paresthesias, signs of intravascular injection Or signs of intrathecal spread were encountered.  Patient was more comfortable after the epidural was dosed. Please see RN's note for documentation of vital signs and FHR which are stable.
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
## 2023-10-29 NOTE — Plan of Care (Signed)
  Problem: Education: Goal: Knowledge of Childbirth will improve Outcome: Adequate for Discharge Goal: Ability to make informed decisions regarding treatment and plan of care will improve Outcome: Adequate for Discharge Goal: Ability to state and carry out methods to decrease the pain will improve Outcome: Adequate for Discharge Goal: Individualized Educational Video(s) Outcome: Not Met (add Reason)   Problem: Coping: Goal: Ability to verbalize concerns and feelings about labor and delivery will improve Outcome: Adequate for Discharge   Problem: Life Cycle: Goal: Ability to make normal progression through stages of labor will improve Outcome: Completed/Met Goal: Ability to effectively push during vaginal delivery will improve Outcome: Completed/Met   Problem: Role Relationship: Goal: Will demonstrate positive interactions with the child Outcome: Adequate for Discharge   Problem: Safety: Goal: Risk of complications during labor and delivery will decrease Outcome: Completed/Met   Problem: Pain Management: Goal: Relief or control of pain from uterine contractions will improve Outcome: Adequate for Discharge

## 2023-10-29 NOTE — MAU Note (Signed)
 Alyssa Cook is a 18 y.o. at [redacted]w[redacted]d here in MAU reporting: was standing and had a sudden, big gush. No warning.  Clear fluid, still coming, though not consistent. Saw some pinkish mucous.  Denies any pain.  Baby is moving. Onset of complaint: 0945.   Pain score: none Vitals:   10/29/23 1155  BP: 112/67  Pulse: 89  Resp: 17  Temp: 99.2 F (37.3 C)  SpO2: 99%     FHT:152 Lab orders placed from triage:  fern

## 2023-10-30 LAB — CBC
HCT: 29.7 % — ABNORMAL LOW (ref 36.0–46.0)
Hemoglobin: 9.9 g/dL — ABNORMAL LOW (ref 12.0–15.0)
MCH: 29 pg (ref 26.0–34.0)
MCHC: 33.3 g/dL (ref 30.0–36.0)
MCV: 87.1 fL (ref 80.0–100.0)
Platelets: 222 K/uL (ref 150–400)
RBC: 3.41 MIL/uL — ABNORMAL LOW (ref 3.87–5.11)
RDW: 14.4 % (ref 11.5–15.5)
WBC: 14.2 K/uL — ABNORMAL HIGH (ref 4.0–10.5)
nRBC: 0 % (ref 0.0–0.2)

## 2023-10-30 LAB — RPR: RPR Ser Ql: NONREACTIVE

## 2023-10-30 NOTE — Lactation Note (Signed)
 This note was copied from a baby's chart. Lactation Consultation Note  Patient Name: Alyssa Cook Unijb'd Date: 10/30/2023 Age:20 hours Reason for consult: 1st time breastfeeding;Early term 37-38.6wks;Follow-up assessment.  P1, LC entered the room, infant was cuing to breastfeeding, FNP changed void and stool diaper while LC was in the room. MOB latched infant on her left breast using pillow support  with the football hold position. Infant sustained his latch and was still breastfeeding after 11 minutes when LC left the room. MOB will continue to breastfeed infant by cues, on demand, 8-12 times within 24 hours, skin to skin. MOB knows to call for further latch assistance if needed. LC discussed the importance of maternal rest, meals and hydration.   Maternal Data    Feeding Mother's Current Feeding Choice: Breast Milk  LATCH Score Latch: Grasps breast easily, tongue down, lips flanged, rhythmical sucking.  Audible Swallowing: Spontaneous and intermittent  Type of Nipple: Everted at rest and after stimulation  Comfort (Breast/Nipple): Soft / non-tender  Hold (Positioning): Assistance needed to correctly position infant at breast and maintain latch.  LATCH Score: 9   Lactation Tools Discussed/Used    Interventions Interventions: Assisted with latch;Breast compression;Adjust position;Support pillows;Position options;Education  Discharge    Consult Status Consult Status: Follow-up Date: 10/31/23 Follow-up type: In-patient    [name] 10/30/2023, 11:38 AM

## 2023-10-30 NOTE — Progress Notes (Signed)
 Post Partum Day 1 Subjective: up ad lib, voiding, tolerating PO, + flatus, and lochia mild. She reports 6/10 abdominal and pelvic pain at this time - due for pain med in an hour. Denies CP, SOB, lightheadedness. Bonding well with baby. They decided to proceed with a circumcision for baby Micah.   Objective: Blood pressure 122/62, pulse 81, temperature 98.5 F (36.9 C), temperature source Oral, resp. rate 18, height 5' 5 (1.651 m), weight 73 kg, last menstrual period 02/23/2023, SpO2 100%, unknown if currently breastfeeding.  Physical Exam:  General: alert, cooperative, and no distress Lochia: appropriate Uterine Fundus: firm Incision: n/a DVT Evaluation: No evidence of DVT seen on physical exam.  Recent Labs    10/29/23 1317 10/30/23 0518  HGB 11.1* 9.9*  HCT 33.4* 29.7*    Assessment/Plan: Plan for discharge tomorrow and Circumcision prior to discharge Ok to give pain medication early at this time.  Routine pp care    LOS: 1 day   Truitt Cruey W Paulena Servais, DO 10/30/2023, 9:53 AM

## 2023-10-30 NOTE — Plan of Care (Signed)
  Problem: Education: Goal: Knowledge of General Education information will improve Description: Including pain rating scale, medication(s)/side effects and non-pharmacologic comfort measures Outcome: Progressing   Problem: Health Behavior/Discharge Planning: Goal: Ability to manage health-related needs will improve Outcome: Progressing   Problem: Clinical Measurements: Goal: Ability to maintain clinical measurements within normal limits will improve Outcome: Progressing Goal: Will remain free from infection Outcome: Progressing Goal: Diagnostic test results will improve Outcome: Progressing Goal: Respiratory complications will improve Outcome: Progressing Goal: Cardiovascular complication will be avoided Outcome: Progressing   Problem: Activity: Goal: Risk for activity intolerance will decrease Outcome: Progressing   Problem: Nutrition: Goal: Adequate nutrition will be maintained Outcome: Progressing   Problem: Coping: Goal: Level of anxiety will decrease Outcome: Progressing   Problem: Elimination: Goal: Will not experience complications related to bowel motility Outcome: Progressing Goal: Will not experience complications related to urinary retention Outcome: Progressing   Problem: Pain Managment: Goal: General experience of comfort will improve and/or be controlled Outcome: Progressing   Problem: Safety: Goal: Ability to remain free from injury will improve Outcome: Progressing   Problem: Skin Integrity: Goal: Risk for impaired skin integrity will decrease Outcome: Progressing   Problem: Education: Goal: Knowledge of Childbirth will improve Outcome: Progressing Goal: Ability to make informed decisions regarding treatment and plan of care will improve Outcome: Progressing Goal: Ability to state and carry out methods to decrease the pain will improve Outcome: Progressing Goal: Individualized Educational Video(s) Outcome: Progressing   Problem:  Coping: Goal: Ability to verbalize concerns and feelings about labor and delivery will improve Outcome: Progressing   Problem: Role Relationship: Goal: Will demonstrate positive interactions with the child Outcome: Progressing   Problem: Pain Management: Goal: Relief or control of pain from uterine contractions will improve Outcome: Progressing   Problem: Education: Goal: Knowledge of condition will improve Outcome: Progressing Goal: Individualized Educational Video(s) Outcome: Progressing Goal: Individualized Newborn Educational Video(s) Outcome: Progressing   Problem: Activity: Goal: Will verbalize the importance of balancing activity with adequate rest periods Outcome: Progressing Goal: Ability to tolerate increased activity will improve Outcome: Progressing   Problem: Coping: Goal: Ability to identify and utilize available resources and services will improve Outcome: Progressing   Problem: Life Cycle: Goal: Chance of risk for complications during the postpartum period will decrease Outcome: Progressing   Problem: Role Relationship: Goal: Ability to demonstrate positive interaction with newborn will improve Outcome: Progressing   Problem: Skin Integrity: Goal: Demonstration of wound healing without infection will improve Outcome: Progressing

## 2023-10-30 NOTE — Anesthesia Postprocedure Evaluation (Signed)
 Anesthesia Post Note  Patient: Shambria Camerer  Procedure(s) Performed: AN AD HOC LABOR EPIDURAL     Patient location during evaluation: Mother Baby Anesthesia Type: Epidural Level of consciousness: awake and alert Pain management: pain level controlled Vital Signs Assessment: post-procedure vital signs reviewed and stable Respiratory status: spontaneous breathing, nonlabored ventilation and respiratory function stable Cardiovascular status: stable Postop Assessment: no headache, no backache and epidural receding Anesthetic complications: no   No notable events documented.  Last Vitals:  Vitals:   10/30/23 0519 10/30/23 0700  BP: (!) 84/71 122/62  Pulse: 89 81  Resp: 18 18  Temp: 36.9 C 36.9 C  SpO2: 99% 100%    Last Pain:  Vitals:   10/30/23 0843  TempSrc:   PainSc: 0-No pain   Pain Goal:                   Mayumi Summerson

## 2023-10-30 NOTE — Lactation Note (Signed)
 This note was copied from a baby's chart. Lactation Consultation Note  Patient Name: Alyssa Cook Unijb'd Date: 10/30/2023 Age:20 hours   Attempted to see mom but she was sleeping.  Maternal Data    Feeding    LATCH Score                    Lactation Tools Discussed/Used    Interventions    Discharge    Consult Status      Chamari Cutbirth G 10/30/2023, 4:54 AM

## 2023-10-30 NOTE — Lactation Note (Signed)
 This note was copied from a baby's chart. Lactation Consultation Note  Patient Name: Alyssa Cook Date: 10/30/2023 Age:20 hours Reason for consult: Initial assessment;Primapara;Early term 37-38.6wks  P1. Mom stated she had just fed him at 05:30. Mom stated baby is BF well. Denies painful latch. Mom encouraged to feed baby 8-12 times/24 hours and with feeding cues. Encouraged to wake baby every 3 hrs if hasn't cued before then. Encouraged to un-swaddle baby for feedings and do STS and lay blanket over outside of baby if needed. Newborn feeding habits, STS, I&O, reviewed. Praised mom for good feedings. Encouraged to call for assistance as needed. Maternal Data Has patient been taught Hand Expression?: No Does the patient have breastfeeding experience prior to this delivery?: No  Feeding    LATCH Score                    Lactation Tools Discussed/Used    Interventions Interventions: Breast feeding basics reviewed;Education;LC Services brochure  Discharge Discharge Education: Outpatient recommendation Pump: Hands Free (mom cozy)  Consult Status Consult Status: Follow-up Date: 10/30/23 (PM) Follow-up type: In-patient    Erie Sica G 10/30/2023, 6:47 AM

## 2023-10-31 MED ORDER — IBUPROFEN 600 MG PO TABS: 600.0000 mg | ORAL_TABLET | Freq: Four times a day (QID) | ORAL | 1 refills | Status: AC | PRN

## 2023-10-31 NOTE — Discharge Instructions (Addendum)
 Call office with any concerns [phone] WHAT TO LOOK OUT FOR: Fever of 100.4 or above Mastitis: feels like flu and breasts hurt Infection: increased pain, swelling or redness Blood clots golf ball size or larger Postpartum depression Pre Eclampsia Weight gain more than 5lbs in a week Blurry vision or seeing spots Increased swelling  Stomach pain under right breast   Congratulations on your newest addition!

## 2023-10-31 NOTE — Lactation Note (Signed)
 This note was copied from a baby's chart. Lactation Consultation Note  Patient Name: Alyssa Cook Unijb'd Date: 10/31/2023 Age:20 hours, P1  Reason for consult: Follow-up assessment;Primapara;1st time breastfeeding;Early term 37-38.6wks;Infant weight loss (5 % weight loss,) Per mom recently fed the baby, Baby asleep at present.  LC reviewed breast feeding basics, breast feeding D/C teaching and the [facility] resources. See below for Flange check and Hand pump.   Maternal Data Does the patient have breastfeeding experience prior to this delivery?: No  Feeding Mother's Current Feeding Choice: Breast Milk  LATCH Score -9     Lactation Tools Discussed/Used Tools: Pump;Flanges Flange Size: 18;21 Breast pump type: Manual Pump Education: Milk Storage;Setup, frequency, and cleaning Reason for Pumping: PRN  Interventions breast feeding basics , education  Hand pump and flange check,  Storage of breast milk and LC resources.     Discharge Discharge Education: Engorgement and breast care;Warning signs for feeding baby;Outpatient recommendation;Other (comment) (if needed. mom has the [facility] resources) Pump: Personal;Hands Free;Manual  Consult Status Consult Status: Complete Date: 10/31/23    Rollene Caldron Hashem Goynes 10/31/2023, 1:15 PM
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
## 2023-10-31 NOTE — Progress Notes (Signed)
 Post Partum Day 2 Subjective: no complaints, up ad lib, voiding, tolerating PO, + flatus, and lochia scant. She is breastfeeding well, bonding, feels comfortable with going home with baby Micah. She denies CP, SOB or HA  Objective: Blood pressure 105/62, pulse 62, temperature 98.2 F (36.8 C), temperature source Oral, resp. rate 18, height 5' 5 (1.651 m), weight 73 kg, last menstrual period 02/23/2023, SpO2 100%, unknown if currently breastfeeding.  Physical Exam:  General: alert, cooperative, and no distress Lochia: appropriate Uterine Fundus:  firm and 2cm below umbilicus  Incision: n/a DVT Evaluation: No evidence of DVT seen on physical exam.  Recent Labs    10/29/23 1317 10/30/23 0518  HGB 11.1* 9.9*  HCT 33.4* 29.7*    Assessment/Plan: Discharge home, Breastfeeding, and Circumcision prior to discharge this am Postpartum care instructions given    LOS: 2 days   Audrick Lamoureaux W Marsha Gundlach, DO 10/31/2023, 9:51 AM

## 2023-10-31 NOTE — Plan of Care (Signed)
 Problem: Education: Goal: Knowledge of General Education information will improve Description: Including pain rating scale, medication(s)/side effects and non-pharmacologic comfort measures 10/31/2023 1115 by Madison Rosina LABOR, LPN Outcome: Adequate for Discharge 10/31/2023 9147 by Madison Rosina LABOR, LPN Outcome: Progressing   Problem: Health Behavior/Discharge Planning: Goal: Ability to manage health-related needs will improve 10/31/2023 1115 by Madison Rosina LABOR, LPN Outcome: Adequate for Discharge 10/31/2023 9147 by Madison Rosina LABOR, LPN Outcome: Progressing   Problem: Clinical Measurements: Goal: Ability to maintain clinical measurements within normal limits will improve 10/31/2023 1115 by Madison Rosina LABOR, LPN Outcome: Adequate for Discharge 10/31/2023 9147 by Madison Rosina LABOR, LPN Outcome: Progressing Goal: Will remain free from infection 10/31/2023 1115 by Madison Rosina LABOR, LPN Outcome: Adequate for Discharge 10/31/2023 9147 by Madison Rosina LABOR, LPN Outcome: Progressing Goal: Diagnostic test results will improve 10/31/2023 1115 by Madison Rosina LABOR, LPN Outcome: Adequate for Discharge 10/31/2023 9147 by Madison Rosina LABOR, LPN Outcome: Progressing Goal: Respiratory complications will improve 10/31/2023 1115 by Madison Rosina LABOR, LPN Outcome: Adequate for Discharge 10/31/2023 9147 by Madison Rosina LABOR, LPN Outcome: Progressing Goal: Cardiovascular complication will be avoided 10/31/2023 1115 by Madison Rosina LABOR, LPN Outcome: Adequate for Discharge 10/31/2023 9147 by Madison Rosina LABOR, LPN Outcome: Progressing   Problem: Activity: Goal: Risk for activity intolerance will decrease 10/31/2023 1115 by Madison Rosina LABOR, LPN Outcome: Adequate for Discharge 10/31/2023 9147 by Madison Rosina LABOR, LPN Outcome: Progressing   Problem: Nutrition: Goal: Adequate nutrition will be maintained 10/31/2023 1115 by Madison Rosina LABOR, LPN Outcome: Adequate for Discharge 10/31/2023 0852 by Madison Rosina LABOR, LPN Outcome:  Progressing   Problem: Coping: Goal: Level of anxiety will decrease 10/31/2023 1115 by Madison Rosina LABOR, LPN Outcome: Adequate for Discharge 10/31/2023 9147 by Madison Rosina LABOR, LPN Outcome: Progressing   Problem: Elimination: Goal: Will not experience complications related to bowel motility 10/31/2023 1115 by Madison Rosina LABOR, LPN Outcome: Adequate for Discharge 10/31/2023 9147 by Madison Rosina LABOR, LPN Outcome: Progressing Goal: Will not experience complications related to urinary retention 10/31/2023 1115 by Madison Rosina LABOR, LPN Outcome: Adequate for Discharge 10/31/2023 9147 by Madison Rosina LABOR, LPN Outcome: Progressing   Problem: Pain Managment: Goal: General experience of comfort will improve and/or be controlled 10/31/2023 1115 by Madison Rosina LABOR, LPN Outcome: Adequate for Discharge 10/31/2023 9147 by Madison Rosina LABOR, LPN Outcome: Progressing   Problem: Safety: Goal: Ability to remain free from injury will improve 10/31/2023 1115 by Madison Rosina LABOR, LPN Outcome: Adequate for Discharge 10/31/2023 9147 by Madison Rosina LABOR, LPN Outcome: Progressing   Problem: Skin Integrity: Goal: Risk for impaired skin integrity will decrease 10/31/2023 1115 by Madison Rosina LABOR, LPN Outcome: Adequate for Discharge 10/31/2023 9147 by Madison Rosina LABOR, LPN Outcome: Progressing   Problem: Education: Goal: Knowledge of Childbirth will improve 10/31/2023 1115 by Madison Rosina LABOR, LPN Outcome: Adequate for Discharge 10/31/2023 9147 by Madison Rosina LABOR, LPN Outcome: Progressing Goal: Ability to make informed decisions regarding treatment and plan of care will improve 10/31/2023 1115 by Madison Rosina LABOR, LPN Outcome: Adequate for Discharge 10/31/2023 9147 by Madison Rosina LABOR, LPN Outcome: Progressing Goal: Ability to state and carry out methods to decrease the pain will improve 10/31/2023 1115 by Madison Rosina LABOR, LPN Outcome: Adequate for Discharge 10/31/2023 9147 by Madison Rosina LABOR, LPN Outcome:  Progressing Goal: Individualized Educational Video(s) 10/31/2023 1115 by Madison Rosina LABOR, LPN Outcome: Adequate for Discharge 10/31/2023 9147 by Madison Rosina LABOR, LPN Outcome: Progressing   Problem: Coping: Goal: Ability to verbalize  concerns and feelings about labor and delivery will improve 10/31/2023 1115 by Madison Rosina LABOR, LPN Outcome: Adequate for Discharge 10/31/2023 9147 by Madison Rosina LABOR, LPN Outcome: Progressing   Problem: Role Relationship: Goal: Will demonstrate positive interactions with the child 10/31/2023 1115 by Madison Rosina LABOR, LPN Outcome: Adequate for Discharge 10/31/2023 9147 by Madison Rosina LABOR, LPN Outcome: Progressing   Problem: Pain Management: Goal: Relief or control of pain from uterine contractions will improve 10/31/2023 1115 by Madison Rosina LABOR, LPN Outcome: Adequate for Discharge 10/31/2023 9147 by Madison Rosina LABOR, LPN Outcome: Progressing   Problem: Education: Goal: Knowledge of condition will improve 10/31/2023 1115 by Madison Rosina LABOR, LPN Outcome: Adequate for Discharge 10/31/2023 9147 by Madison Rosina LABOR, LPN Outcome: Progressing Goal: Individualized Educational Video(s) 10/31/2023 1115 by Madison Rosina LABOR, LPN Outcome: Adequate for Discharge 10/31/2023 9147 by Madison Rosina LABOR, LPN Outcome: Progressing Goal: Individualized Newborn Educational Video(s) 10/31/2023 1115 by Madison Rosina LABOR, LPN Outcome: Adequate for Discharge 10/31/2023 9147 by Madison Rosina LABOR, LPN Outcome: Progressing   Problem: Activity: Goal: Will verbalize the importance of balancing activity with adequate rest periods 10/31/2023 1115 by Madison Rosina LABOR, LPN Outcome: Adequate for Discharge 10/31/2023 9147 by Madison Rosina LABOR, LPN Outcome: Progressing Goal: Ability to tolerate increased activity will improve 10/31/2023 1115 by Madison Rosina LABOR, LPN Outcome: Adequate for Discharge 10/31/2023 9147 by Madison Rosina LABOR, LPN Outcome: Progressing   Problem: Coping: Goal: Ability to  identify and utilize available resources and services will improve 10/31/2023 1115 by Madison Rosina LABOR, LPN Outcome: Adequate for Discharge 10/31/2023 9147 by Madison Rosina LABOR, LPN Outcome: Progressing   Problem: Life Cycle: Goal: Chance of risk for complications during the postpartum period will decrease 10/31/2023 1115 by Madison Rosina LABOR, LPN Outcome: Adequate for Discharge 10/31/2023 9147 by Madison Rosina LABOR, LPN Outcome: Progressing   Problem: Role Relationship: Goal: Ability to demonstrate positive interaction with newborn will improve 10/31/2023 1115 by Madison Rosina LABOR, LPN Outcome: Adequate for Discharge 10/31/2023 9147 by Madison Rosina LABOR, LPN Outcome: Progressing   Problem: Skin Integrity: Goal: Demonstration of wound healing without infection will improve 10/31/2023 1115 by Madison Rosina LABOR, LPN Outcome: Adequate for Discharge 10/31/2023 9147 by Madison Rosina LABOR, LPN Outcome: Progressing

## 2023-10-31 NOTE — Discharge Summary (Signed)
 Postpartum Discharge Summary  Date of Service updated      Patient Name: Alyssa Cook DOB: 2005/01/22 MRN: [id number]  Date of admission: 10/29/2023 Delivery date:10/29/2023 Delivering provider: CLAIRE RAMAN A Date of discharge: 10/31/2023  Admitting diagnosis: Pregnant and not yet delivered in third trimester [Z34.93] Intrauterine pregnancy: [redacted]w[redacted]d     Secondary diagnosis:  Principal Problem:   Pregnant and not yet delivered in third trimester  Additional problems: none    Discharge diagnosis: Term Pregnancy Delivered                                              Post partum procedures:none Augmentation: Pitocin Complications: None  Hospital course: Onset of Labor With Vaginal Delivery      18 y.o. yo G1P1001 at [redacted]w[redacted]d was admitted in Active Labor on 10/29/2023. Labor course was complicated by n/a  Membrane Rupture Time/Date: 9:45 AM,10/29/2023  Delivery Method:Vaginal, Spontaneous Operative Delivery:N/A Episiotomy:   Lacerations:  2nd degree;Perineal Patient had a postpartum course complicated by n/a.  She is ambulating, tolerating a regular diet, passing flatus, and urinating well. Patient is discharged home in stable condition on 10/31/23.  Newborn Data: Birth date:10/29/2023 Birth time:8:11 PM Gender:Female Living status:Living Apgars:8 ,9  Weight:3320 g  Magnesium  Sulfate received: No BMZ received: No Rhophylac:N/A MMR:N/A T-DaP:Given prenatally Flu: No RSV Vaccine received: Yes Transfusion:No Immunizations administered: Immunization History  Administered Date(s) Administered    sv, Bivalent, Protein Subunit Rsvpref,pf [place]) 09/27/2023   Tdap 08/27/2023    Physical exam  Vitals:   10/30/23 0700 10/30/23 1100 10/30/23 2300 10/31/23 0522  BP: 122/62 116/70 118/68 105/62  Pulse: 81 75 74 62  Resp: 18 18 18 18   Temp: 98.5 F (36.9 C) 98.2 F (36.8 C) 98.4 F (36.9 C) 98.2 F (36.8 C)  TempSrc: Oral  Oral Oral  SpO2: 100% 100%     Weight:      Height:       General: alert, cooperative, and no distress Lochia: appropriate Uterine Fundus: firm Incision: N/A DVT Evaluation: No evidence of DVT seen on physical exam. Labs: Lab Results  Component Value Date   WBC 14.2 (H) 10/30/2023   HGB 9.9 (L) 10/30/2023   HCT 29.7 (L) 10/30/2023   MCV 87.1 10/30/2023   PLT 222 10/30/2023      Latest Ref Rng & Units 04/06/2022   12:28 PM  CMP  Glucose 70 - 99 mg/dL 84   BUN 6 - 20 mg/dL 8   Creatinine 9.55 - 8.99 mg/dL 9.53   Sodium 864 - 854 mmol/L 134   Potassium 3.5 - 5.1 mmol/L 3.8   Chloride 98 - 111 mmol/L 106   CO2 22 - 32 mmol/L 19   Calcium 8.9 - 10.3 mg/dL 8.9    Edinburgh Score:    10/30/2023    7:03 AM  Edinburgh Postnatal Depression Scale Screening Tool  I have been able to laugh and see the funny side of things. 1  I have looked forward with enjoyment to things. 0  I have blamed myself unnecessarily when things went wrong. 3  I have been anxious or worried for no good reason. 3  I have felt scared or panicky for no good reason. 2  Things have been getting on top of me. 2  I have been so unhappy that I have had difficulty sleeping. 2  I have felt sad or miserable. 2  I have been so unhappy that I have been crying. 3  The thought of harming myself has occurred to me. 0  Edinburgh Postnatal Depression Scale Total 18      After visit meds:  Allergies as of 10/31/2023   No Known Allergies      Medication List     TAKE these medications    acetaminophen  500 MG tablet Commonly known as: TYLENOL  Take 500 mg by mouth every 6 (six) hours as needed.   calcium carbonate 500 MG chewable tablet Commonly known as: TUMS - dosed in mg elemental calcium Chew 1 tablet by mouth daily.   divalproex  250 MG DR tablet Commonly known as: Depakote  Take 1 tablet (250 mg total) by mouth 2 (two) times daily.   FLUoxetine  20 MG capsule Commonly known as: PROzac  Take 1 capsule (20 mg total) by mouth  daily.   FLUoxetine  40 MG capsule Commonly known as: PROZAC  TAKE 1 CAPSULE (40 MG TOTAL) BY MOUTH DAILY.   fluticasone  50 MCG/ACT nasal spray Commonly known as: FLONASE  SPRAY 2 SPRAYS INTO EACH NOSTRIL EVERY DAY   folic acid 1 MG tablet Commonly known as: FOLVITE Take 1 mg by mouth daily.   ibuprofen  600 MG tablet Commonly known as: ADVIL  Take 1 tablet (600 mg total) by mouth every 6 (six) hours as needed for moderate pain (pain score 4-6) or cramping.   multivitamin-prenatal 27-0.8 MG Tabs tablet Take 1 tablet by mouth daily at 12 noon.   prazosin  2 MG capsule Commonly known as: MINIPRESS  Take 1 capsule (2 mg total) by mouth at bedtime.         Discharge home in stable condition Infant Feeding: Breast Infant Disposition:home with mother Discharge instruction: per After Visit Summary and Postpartum booklet. Activity: Advance as tolerated. Pelvic rest for 6 weeks.  Diet: routine diet Anticipated Birth Control: Unsure Postpartum Appointment:6 weeks Additional Postpartum F/U: Postpartum Depression checkup Future Appointments: Future Appointments  Date Time Provider Department Center  01/12/2024  3:20 PM Babs Arthea DASEN, MD [facility]  02/14/2024  3:00 PM Corina Norleen SAUNDERS, PsyD [facility]  03/13/2024  4:00 PM Corina Norleen SAUNDERS, PsyD [facility]  04/13/2024  1:00 PM Rodenbough, Norleen SAUNDERS, PsyD [facility]   Follow up Visit:  Follow-up Information     Ob/Gyn, Landy Stains. Schedule an appointment as soon as possible for a visit in 6 week(s).   Why: For postpartum visit Contact information: [address] [place] [address] [phone]                     10/31/2023 Ted LELON Solo, DO

## 2023-10-31 NOTE — Plan of Care (Signed)
 Patient progressing appropriately.

## 2023-10-31 NOTE — Plan of Care (Signed)
  Problem: Education: Goal: Knowledge of General Education information will improve Description: Including pain rating scale, medication(s)/side effects and non-pharmacologic comfort measures Outcome: Progressing   Problem: Health Behavior/Discharge Planning: Goal: Ability to manage health-related needs will improve Outcome: Progressing   Problem: Clinical Measurements: Goal: Ability to maintain clinical measurements within normal limits will improve Outcome: Progressing Goal: Will remain free from infection Outcome: Progressing Goal: Diagnostic test results will improve Outcome: Progressing Goal: Respiratory complications will improve Outcome: Progressing Goal: Cardiovascular complication will be avoided Outcome: Progressing   Problem: Activity: Goal: Risk for activity intolerance will decrease Outcome: Progressing   Problem: Nutrition: Goal: Adequate nutrition will be maintained Outcome: Progressing   Problem: Coping: Goal: Level of anxiety will decrease Outcome: Progressing   Problem: Elimination: Goal: Will not experience complications related to bowel motility Outcome: Progressing Goal: Will not experience complications related to urinary retention Outcome: Progressing   Problem: Pain Managment: Goal: General experience of comfort will improve and/or be controlled Outcome: Progressing   Problem: Safety: Goal: Ability to remain free from injury will improve Outcome: Progressing   Problem: Skin Integrity: Goal: Risk for impaired skin integrity will decrease Outcome: Progressing   Problem: Education: Goal: Knowledge of Childbirth will improve Outcome: Progressing Goal: Ability to make informed decisions regarding treatment and plan of care will improve Outcome: Progressing Goal: Ability to state and carry out methods to decrease the pain will improve Outcome: Progressing Goal: Individualized Educational Video(s) Outcome: Progressing   Problem:  Coping: Goal: Ability to verbalize concerns and feelings about labor and delivery will improve Outcome: Progressing   Problem: Role Relationship: Goal: Will demonstrate positive interactions with the child Outcome: Progressing   Problem: Pain Management: Goal: Relief or control of pain from uterine contractions will improve Outcome: Progressing   Problem: Education: Goal: Knowledge of condition will improve Outcome: Progressing Goal: Individualized Educational Video(s) Outcome: Progressing Goal: Individualized Newborn Educational Video(s) Outcome: Progressing   Problem: Activity: Goal: Will verbalize the importance of balancing activity with adequate rest periods Outcome: Progressing Goal: Ability to tolerate increased activity will improve Outcome: Progressing   Problem: Coping: Goal: Ability to identify and utilize available resources and services will improve Outcome: Progressing   Problem: Life Cycle: Goal: Chance of risk for complications during the postpartum period will decrease Outcome: Progressing   Problem: Role Relationship: Goal: Ability to demonstrate positive interaction with newborn will improve Outcome: Progressing   Problem: Skin Integrity: Goal: Demonstration of wound healing without infection will improve Outcome: Progressing

## 2023-11-03 LAB — SURGICAL PATHOLOGY

## 2023-11-16 ENCOUNTER — Telehealth (HOSPITAL_COMMUNITY): Payer: Self-pay | Admitting: *Deleted

## 2023-11-16 NOTE — Telephone Encounter (Signed)
 11/16/2023  Name: Alyssa Cook MRN: [id number] DOB: Jan 22, 2005  Reason for Call:  Transition of Care Hospital Discharge Call  Contact Status: Patient Contact Status: Complete  Language assistant needed: Interpreter Mode: Interpreter Not Needed        Follow-Up Questions: Do You Have Any Concerns About Your Health As You Heal From Delivery?: No Do You Have Any Concerns About Your Infants Health?: No  Edinburgh Postnatal Depression Scale:  In the Past 7 Days: I have been able to laugh and see the funny side of things.: Not quite so much now I have looked forward with enjoyment to things.: Rather less than I used to I have blamed myself unnecessarily when things went wrong.: Yes, most of the time I have been anxious or worried for no good reason.: Yes, very often I have felt scared or panicky for no good reason.: No, not much Things have been getting on top of me.: Yes, sometimes I haven't been coping as well as usual I have been so unhappy that I have had difficulty sleeping.: Not at all I have felt sad or miserable.: Not very often I have been so unhappy that I have been crying.: Yes, quite often The thought of harming myself has occurred to me.: Never Edinburgh Postnatal Depression Scale Total: (!) 14  PHQ2-9 Depression Scale:     Discharge Follow-up: Edinburgh score requires follow up?: Yes Provider notified of Edinburgh score?: Yes Have you already been referred for a counseling appointment?: Yes (Neuropsychiatrist) Date of appointment:: 01/03/24 Any barriers for going to appointment?: No barriers for going to appointment. Patient was advised of the following resources:: Support Group, Breastfeeding Support Group Patient said she feels her mood is stable and does not feel the need to see neuropsychiatrist sooner than above appointment. Post-discharge interventions: Reviewed Newborn Safe Sleep Practices Maternal Mental Health Resources provided  Mliss Sieve,  RN 11/16/2023 10:54

## 2023-12-29 ENCOUNTER — Emergency Department (HOSPITAL_COMMUNITY)
Admission: EM | Admit: 2023-12-29 | Discharge: 2023-12-29 | Discharge status: Against Medical Advice | Attending: Emergency Medicine | Admitting: Emergency Medicine

## 2023-12-29 DIAGNOSIS — Z5321 Procedure and treatment not carried out due to patient leaving prior to being seen by health care provider: Secondary | ICD-10-CM | POA: Insufficient documentation

## 2023-12-29 DIAGNOSIS — R059 Cough, unspecified: Secondary | ICD-10-CM | POA: Diagnosis present

## 2023-12-29 DIAGNOSIS — U071 COVID-19: Secondary | ICD-10-CM | POA: Insufficient documentation

## 2023-12-29 LAB — GROUP A STREP BY PCR: Group A Strep by PCR: NOT DETECTED

## 2023-12-29 LAB — RESP PANEL BY RT-PCR (RSV, FLU A&B, COVID)  RVPGX2
Influenza A by PCR: NEGATIVE
Influenza B by PCR: NEGATIVE
Resp Syncytial Virus by PCR: NEGATIVE
SARS Coronavirus 2 by RT PCR: POSITIVE — AB

## 2023-12-29 NOTE — ED Triage Notes (Signed)
 Cough, fever, sore throat and congestion x 3 days

## 2023-12-29 NOTE — ED Notes (Signed)
 Pt called 3x to be roomed - no answer

## 2023-12-29 NOTE — ED Triage Notes (Signed)
 Pt c/o nasal congestion, nasal drainage, sore throat, bodyachesx2d

## 2023-12-29 NOTE — ED Notes (Signed)
 Pt states she has a newborn who was seen in Peds and may not be able to stay for results.  She states she has My Chart if she has to leva.

## 2024-01-12 ENCOUNTER — Encounter: Payer: Self-pay | Admitting: Physical Medicine & Rehabilitation

## 2024-01-12 ENCOUNTER — Encounter: Attending: Psychology | Admitting: Physical Medicine & Rehabilitation

## 2024-01-12 VITALS — BP 108/74 | HR 72 | Ht 65.0 in | Wt 127.4 lb

## 2024-01-12 DIAGNOSIS — S062X9S 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, sequela: Secondary | ICD-10-CM | POA: Diagnosis not present

## 2024-01-12 DIAGNOSIS — F09 Unspecified 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: Secondary | ICD-10-CM | POA: Diagnosis not present

## 2024-01-12 DIAGNOSIS — R41840 Attention and concentration deficit: Secondary | ICD-10-CM | POA: Diagnosis present

## 2024-01-12 DIAGNOSIS — F4310 Post-traumatic stress disorder, unspecified: Secondary | ICD-10-CM | POA: Insufficient documentation

## 2024-01-12 NOTE — Progress Notes (Signed)
 "  Subjective:    Patient ID: Alyssa Cook, female    DOB: 07/26/2005, 18 y.o.   MRN: [id number]  HPI  Discussed the use of AI scribe software for clinical note transcription with the patient, who gave verbal consent to proceed.  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 - Marked improvement in emotional well-being since childbirth. - No mood disturbances, depressive symptoms, or recent nightmares. - Sleep quality generally improved; infant sleeps up to seven hours consecutively on some nights, though intermittent disrupted sleep persists. - Mother provides substantial assistance with childcare.  Major depressive disorder - No current depressive symptoms. - Improved emotional state attributed to sobriety and motivation for personal growth following motherhood. - Continues fluoxetine  therapy.  Urinary function - History of stress urinary incontinence, significantly improved since restarting mirabegron . - No current complaints regarding bladder function. - Bowel and bladder function are normal post-partum.  Medication management - Discontinued valproate during pregnancy and has not resumed it. - Continues prazosin  and fluoxetine . - Currently using a combination of breastfeeding and bottle feeding.  Weight trajectory - Pre-pregnancy weight: 117 lbs. - Weight at term: 167 lbs. - Current weight: approximately 127 lbs.   Pain Inventory Average Pain 6 Pain Right Now 3 My pain is dull  In the last 24 hours, has pain interfered with the following? General activity N/A Relation with others N/A Enjoyment of life N/A What TIME of day is your pain at its worst? No pain Sleep (in general)  Fair  Pain is worse with: N/A Pain improves with: N/A Relief from Meds: N/A  Family History  Problem Relation Age of Onset   Heart disease Mother        afib   Other Father        hypoglycemia   Heart disease Other    Social History   Socioeconomic History   Marital status: Single    Spouse name: Not on file   Number of children: Not on file   Years of education: Not on file   Highest education level: Not on file  Occupational History   Not on file  Tobacco Use   Smoking status: Never    Passive exposure: Never   Smokeless tobacco: Former  Advertising Account Planner   Vaping status: Former  Substance and Sexual Activity   Alcohol use: No   Drug use: Not Currently    Types: Marijuana    Comment: prior to preg   Sexual activity: Not Currently  Other Topics Concern   Not on file  Social History Narrative   ** Merged History Encounter **       Social Drivers of Health   Tobacco Use: Medium Risk (01/12/2024)   Patient History    Smoking Tobacco Use: Never    Smokeless Tobacco Use: Former    Passive Exposure: Never  Programmer, Applications: Not on file  Food Insecurity: No Food Insecurity (10/29/2023)   Epic    Worried About Programme Researcher, Broadcasting/film/video in the Last Year: Never true    Ran Out of Food in the Last Year: Never true  Transportation Needs: No Transportation Needs (10/29/2023)   Epic    Lack of Transportation (Medical): No    Lack of Transportation (Non-Medical): No  Physical Activity:  Not on file  Stress: Not on file  Social Connections: Not on file  Depression (PHQ2-9): Low Risk (09/15/2023)   Depression (PHQ2-9)    PHQ-2 Score: 2  Alcohol Screen: Not on file  Housing: Unknown (10/29/2023)   Epic    Unable to Pay for Housing in the Last Year: Not on file    Number of Times Moved in the Last Year: 0    Homeless in the Last Year: Not on file  Utilities: Not At Risk (10/29/2023)   Epic    Threatened with loss of utilities: No  Health Literacy: Not on file   Past  Surgical History:  Procedure Laterality Date   IR REPLACE G-TUBE SIMPLE WO FLUORO  03/03/2022   Past Surgical History:  Procedure Laterality Date   IR REPLACE G-TUBE SIMPLE WO FLUORO  03/03/2022   Past Medical History:  Diagnosis Date   Anemia    fe infusion during preg   Asthma    Brain injury (HCC)    Depression    BP 108/74 (BP Location: Left Arm, Patient Position: Sitting, Cuff Size: Normal)   Pulse 72   Ht 5' 5 (1.651 m)   Wt 127 lb 6.4 oz (57.8 kg)   LMP 02/23/2023   SpO2 98%   BMI 21.20 kg/m   Opioid Risk Score:   Fall Risk Score:  `1  Depression screen [facility] 2/9     09/15/2023    2:56 PM 05/19/2023    3:17 PM 05/19/2023    3:10 PM 10/21/2022    3:06 PM 08/19/2022    3:27 PM 07/01/2022   10:28 AM 04/01/2022   11:29 AM  Depression screen PHQ 2/9  Decreased Interest 1 1 1  0 3 1 1   Down, Depressed, Hopeless 1 1 1  0 3 1 2   PHQ - 2 Score 2 2 2  0 6 2 3   Altered sleeping     0  2  Tired, decreased energy     2  3  Change in appetite     0  0  Feeling bad or failure about yourself      3  3  Trouble concentrating       1  Moving slowly or fidgety/restless     3  0  Suicidal thoughts     1  1  PHQ-9 Score     15   13      Data saved with a previous flowsheet row definition     Review of Systems  All other systems reviewed and are negative.      Objective:   Physical Exam  General: No acute distress HEENT: NCAT, EOMI, oral membranes moist Cards: reg rate  Chest: normal effort Abdomen: Soft, NT, ND Skin: dry, intact Extremities: no edema Psych: pleasant and appropriate  Skin: intact Neuro: Alert and oriented x 3.  Improving insight and awareness. Intact Memory.  processing and attention is more focused. Less relliant on her mom  to answer questions.  .  Normal language and speech. Cranial nerve exam unremarkable. MMT: Grossly 4-5 out of 5 in all 4 limbs.  Normal gait.   She has no abnormal tone present.  Sensory exam is intact to pinprick and light touch in  all 4. -no changes today.   Musculoskeletal: Full ROM, No pain with AROM or PROM in the neck, trunk, or extremities. Posture appropriate             Assessment & Plan:  Diffuse traumatic  btrain injury 12/2021 -Still requires supervision and home d/t safety awareness -STILL no driving--?--trials  -she is non-vocational at this point 2. Dysphonia:             -Patient has made continued improvement in her vocal quality over the last several months             -prior voice therapy, (pepcid , decongestants on hold) per ENT.   3. Impulsive behavior, hx of bipolars             -Continue Prozac  at 60 mg daily             -holding vpa due to pregnancy--schedule D -situational management of social situation and mood was discussed. Neuropsych follow up 4. Depression/suicidal ideation             -birth of her baby has helped her mood immensely              -continue Prozac   60 mg daily for now             -Prazosin  --continue 2mg  capsule at bedtime for nightmares.   -is sometimes sleeping 6-7 hours per night now that baby is starting to sleep more              -follow up with Dr. Corina over the next month or two 5. Stress incontinence             -back on myrbetriq -- incontinence improved 6. Delivered baby 10/29/23!!             -baby is healthy and doing well. So is mom!!     20  minutes of face to face patient care time were spent during this visit. All questions were encouraged and answered. Follow up with me in 4 mos.       "

## 2024-01-12 NOTE — Patient Instructions (Signed)
" °  VISIT SUMMARY: Today, you came in for a follow-up visit to discuss your cognitive, neurobehavioral, and urinary health after your recent childbirth. We reviewed your progress and made plans to continue your current treatments.  YOUR PLAN: -DIFFUSE TRAUMATIC BRAIN INJURY: Diffuse traumatic brain injury is a type of brain injury that affects multiple areas of the brain. Your emotional well-being has improved significantly since childbirth, and you have no mood disturbances or depressive symptoms. We will continue your current medications, fluoxetine  60 mg daily and prazosin  2 mg at bedtime, and reassess your status in the summer.  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 Your mood and behavior have improved since childbirth, and you have no current depressive symptoms or impulsivity. We will continue your current medications, fluoxetine  and prazosin , and have deferred valproate due to your stability and breastfeeding.  -STRESS INCONTINENCE: Stress incontinence is the unintentional loss of urine during physical activity. Your symptoms have significantly improved with mirabegron , and you have no current complaints. We will continue your mirabegron  treatment.  INSTRUCTIONS: Please continue taking your medications as prescribed: fluoxetine  60 mg daily, prazosin  2 mg at bedtime, and mirabegron  for stress incontinence. We will follow up in the summer to reassess your status and consider any necessary medication adjustments.     "

## 2024-01-27 ENCOUNTER — Encounter: Payer: Self-pay | Admitting: Psychology

## 2024-01-27 ENCOUNTER — Encounter: Admitting: Psychology

## 2024-01-27 DIAGNOSIS — F4310 Post-traumatic stress disorder, unspecified: Secondary | ICD-10-CM | POA: Diagnosis not present

## 2024-01-27 DIAGNOSIS — F09 Unspecified 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: Secondary | ICD-10-CM | POA: Diagnosis not present

## 2024-01-27 DIAGNOSIS — S062X9S 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, sequela: Secondary | ICD-10-CM

## 2024-01-27 DIAGNOSIS — R41840 Attention and concentration deficit: Secondary | ICD-10-CM
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
## 2024-02-04 ENCOUNTER — Encounter: Payer: Self-pay | Admitting: Physical Medicine & Rehabilitation

## 2024-02-04 DIAGNOSIS — R45851 Suicidal ideations: Secondary | ICD-10-CM

## 2024-02-04 DIAGNOSIS — F09 Unspecified 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: Secondary | ICD-10-CM

## 2024-02-04 DIAGNOSIS — S062X9S 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, sequela: Secondary | ICD-10-CM

## 2024-02-04 MED ORDER — DIVALPROEX SODIUM 250 MG PO DR TAB: 250.0000 mg | DELAYED_RELEASE_TABLET | Freq: Two times a day (BID) | ORAL | 4 refills | Status: AC

## 2024-02-04 NOTE — Telephone Encounter (Signed)
 Depakote  restarted per pt request. I notified [place]

## 2024-02-14 ENCOUNTER — Ambulatory Visit: Admitting: Psychology

## 2024-03-09 ENCOUNTER — Ambulatory Visit: Admitting: Urology

## 2024-03-13 ENCOUNTER — Ambulatory Visit: Admitting: Psychology

## 2024-04-13 ENCOUNTER — Ambulatory Visit: Admitting: Psychology

## 2024-07-05 ENCOUNTER — Encounter: Admitting: Physical Medicine & Rehabilitation
